# Patient Record
Sex: Male | Born: 1960
Health system: Southern US, Community
[De-identification: ages and names within clinical notes are randomized; demographics above are authoritative.]

## PROBLEM LIST (undated history)

## (undated) HISTORY — PX: HERNIA REPAIR: SHX51

## (undated) HISTORY — PX: RHINOPLASTY: SUR1284

## (undated) HISTORY — PX: WISDOM TOOTH EXTRACTION: SHX21

---

## 2014-09-12 ENCOUNTER — Encounter: Payer: Self-pay | Admitting: Internal Medicine

## 2015-05-21 ENCOUNTER — Encounter: Payer: Self-pay | Admitting: Internal Medicine

## 2015-07-27 ENCOUNTER — Ambulatory Visit (AMBULATORY_SURGERY_CENTER): Payer: Self-pay

## 2015-07-27 VITALS — Ht 72.0 in | Wt 198.0 lb

## 2015-07-27 DIAGNOSIS — Z1211 Encounter for screening for malignant neoplasm of colon: Secondary | ICD-10-CM

## 2015-07-27 NOTE — Progress Notes (Signed)
No allergies to eggs or soy No diet/weight loss meds No home oxygen No past problems with anesthesia  Has emai  Emmi instructions given for colonoscopy

## 2015-08-10 ENCOUNTER — Ambulatory Visit (AMBULATORY_SURGERY_CENTER): Payer: PRIVATE HEALTH INSURANCE | Admitting: Internal Medicine

## 2015-08-10 ENCOUNTER — Encounter: Payer: Self-pay | Admitting: Internal Medicine

## 2015-08-10 VITALS — BP 107/68 | HR 53 | Temp 96.6°F | Resp 18 | Ht 72.0 in | Wt 198.0 lb

## 2015-08-10 DIAGNOSIS — Z1211 Encounter for screening for malignant neoplasm of colon: Secondary | ICD-10-CM

## 2015-08-10 MED ORDER — SODIUM CHLORIDE 0.9 % IV SOLN: 500.0000 mL | INTRAVENOUS | Status: DC

## 2015-08-10 NOTE — Patient Instructions (Addendum)
No polyps or cancer detected!  You do have mild diverticulosis - thickened muscle rings and pouches in the colon wall. Please read the handout about this condition.  Next routine colonoscopy/screenin test in 10 years - 2026  I appreciate the opportunity to care for you. Iva Boop, MD, FACG       YOU HAD AN ENDOSCOPIC PROCEDURE TODAY AT THE [facility]:   Refer to the procedure report that was given to you for any specific questions about what was found during the examination.  If the procedure report does not answer your questions, please call your gastroenterologist to clarify.  If you requested that your care partner not be given the details of your procedure findings, then the procedure report has been included in a sealed envelope for you to review at your convenience later.  YOU SHOULD EXPECT: Some feelings of bloating in the abdomen. Passage of more gas than usual.  Walking can help get rid of the air that was put into your GI tract during the procedure and reduce the bloating. If you had a lower endoscopy (such as a colonoscopy or flexible sigmoidoscopy) you may notice spotting of blood in your stool or on the toilet paper. If you underwent a bowel prep for your procedure, you may not have a normal bowel movement for a few days.  Please Note:  You might notice some irritation and congestion in your nose or some drainage.  This is from the oxygen used during your procedure.  There is no need for concern and it should clear up in a day or so.  SYMPTOMS TO REPORT IMMEDIATELY:   Following lower endoscopy (colonoscopy or flexible sigmoidoscopy):  Excessive amounts of blood in the stool  Significant tenderness or worsening of abdominal pains  Swelling of the abdomen that is new, acute  Fever of 100F or higher    For urgent or emergent issues, a gastroenterologist can be reached at any hour by calling [phone].   DIET: Your first meal following the  procedure should be a small meal and then it is ok to progress to your normal diet. Heavy or fried foods are harder to digest and may make you feel nauseous or bloated.  Likewise, meals heavy in dairy and vegetables can increase bloating.  Drink plenty of fluids but you should avoid alcoholic beverages for 24 hours.  ACTIVITY:  You should plan to take it easy for the rest of today and you should NOT DRIVE or use heavy machinery until tomorrow (because of the sedation medicines used during the test).    FOLLOW UP: Our staff will call the number listed on your records the next business day following your procedure to check on you and address any questions or concerns that you may have regarding the information given to you following your procedure. If we do not reach you, we will leave a message.  However, if you are feeling well and you are not experiencing any problems, there is no need to return our call.  We will assume that you have returned to your regular daily activities without incident.  If any biopsies were taken you will be contacted by phone or by letter within the next 1-3 weeks.  Please call us at [phone] if you have not heard about the biopsies in 3 weeks.    SIGNATURES/CONFIDENTIALITY: You and/or your care partner have signed paperwork which will be entered into your electronic medical record.  These signatures attest  to the fact that that the information above on your After Visit Summary has been reviewed and is understood.  Full responsibility of the confidentiality of this discharge information lies with you and/or your care-partner.   Information given on diverticulosis.

## 2015-08-10 NOTE — Progress Notes (Signed)
To recovery, report to Brown, RN, VSS. 

## 2015-08-10 NOTE — Op Note (Signed)
[facility] [address] [place], [address]   COLONOSCOPY PROCEDURE REPORT  PATIENT: Matthew Gonzalez, Matthew Gonzalez  MR#: [id number] BIRTHDATE: 08-Apr-1961 , 54  yrs. old GENDER: male ENDOSCOPIST: Iva Boop, MD, [facility] PROCEDURE DATE:  08/10/2015 PROCEDURE:   Colonoscopy, screening First Screening Colonoscopy - Avg.  risk and is 50 yrs.  old or older Yes.  Prior Negative Screening - Now for repeat screening. N/A  History of Adenoma - Now for follow-up colonoscopy & has been > or = to 3 yrs.  N/A  Polyps removed today? No Recommend repeat exam, <10 yrs? No ASA CLASS:   Class I INDICATIONS:Screening for colonic neoplasia and Colorectal Neoplasm Risk Assessment for this procedure is average risk. MEDICATIONS: Propofol 240 mg IV and Monitored anesthesia care  DESCRIPTION OF PROCEDURE:   After the risks benefits and alternatives of the procedure were thoroughly explained, informed consent was obtained.  The digital rectal exam revealed no abnormalities of the rectum, revealed no prostatic nodules, and revealed the prostate was not enlarged.   The LB [id number] endoscope was introduced through the anus and advanced to the cecum, which was identified by both the appendix and ileocecal valve. No adverse events experienced.   The quality of the prep was excellent.  (MiraLax was used)  The instrument was then slowly withdrawn as the colon was fully examined. Estimated blood loss is zero unless otherwise noted in this procedure report.      COLON FINDINGS: There was mild diverticulosis noted in the sigmoid colon.   The examination was otherwise normal.   Right colon retroflexion included.  Retroflexed views revealed no abnormalities. The time to cecum = 2.4 Withdrawal time = 10.0   The scope was withdrawn and the procedure completed. COMPLICATIONS: There were no immediate complications.  ENDOSCOPIC IMPRESSION: 1.   Mild diverticulosis was noted in the sigmoid colon 2.    The examination was otherwise normal - excellent prep  RECOMMENDATIONS: Repeat colon screening test in 2026 -  10 years.  eSigned:  Iva Boop, MD, [facility] 08/10/2015 10:55 AM   cc: Rodrigo Ran, MD and The Patient

## 2015-08-11 ENCOUNTER — Telehealth: Payer: Self-pay | Admitting: *Deleted

## 2015-08-11 NOTE — Telephone Encounter (Signed)
  Follow up Call-  Call back number 08/10/2015  Post procedure Call Back phone  # [phone] Work number  Permission to leave phone message Yes     Patient questions:  Do you have a fever, pain , or abdominal swelling? No. Pain Score  0 *  Have you tolerated food without any problems? Yes.    Have you been able to return to your normal activities? Yes.    Do you have any questions about your discharge instructions: Diet   No. Medications  No. Follow up visit  No.  Do you have questions or concerns about your Care? No.  Actions: * If pain score is 4 or above: No action needed, pain <4.

## 2016-12-05 ENCOUNTER — Encounter (HOSPITAL_BASED_OUTPATIENT_CLINIC_OR_DEPARTMENT_OTHER): Payer: Self-pay | Admitting: *Deleted

## 2016-12-05 ENCOUNTER — Emergency Department (HOSPITAL_BASED_OUTPATIENT_CLINIC_OR_DEPARTMENT_OTHER)
Admission: EM | Admit: 2016-12-05 | Discharge: 2016-12-05 | Disposition: A | Discharge status: Home / Self Care | Payer: 59 | Attending: Emergency Medicine | Admitting: Emergency Medicine

## 2016-12-05 ENCOUNTER — Emergency Department (HOSPITAL_BASED_OUTPATIENT_CLINIC_OR_DEPARTMENT_OTHER): Payer: 59

## 2016-12-05 DIAGNOSIS — Y9389 Activity, other specified: Secondary | ICD-10-CM | POA: Diagnosis not present

## 2016-12-05 DIAGNOSIS — Y999 Unspecified external cause status: Secondary | ICD-10-CM | POA: Diagnosis not present

## 2016-12-05 DIAGNOSIS — F17290 Nicotine dependence, other tobacco product, uncomplicated: Secondary | ICD-10-CM | POA: Insufficient documentation

## 2016-12-05 DIAGNOSIS — W260XXA Contact with knife, initial encounter: Secondary | ICD-10-CM | POA: Diagnosis not present

## 2016-12-05 DIAGNOSIS — Z23 Encounter for immunization: Secondary | ICD-10-CM | POA: Insufficient documentation

## 2016-12-05 DIAGNOSIS — S61311A Laceration without foreign body of left index finger with damage to nail, initial encounter: Secondary | ICD-10-CM | POA: Diagnosis not present

## 2016-12-05 DIAGNOSIS — Y929 Unspecified place or not applicable: Secondary | ICD-10-CM | POA: Insufficient documentation

## 2016-12-05 MED ORDER — TETANUS-DIPHTH-ACELL PERTUSSIS 5-2.5-18.5 LF-MCG/0.5 IM SUSP
0.5000 mL | Freq: Once | INTRAMUSCULAR | Status: AC
  Administered 2016-12-05: 0.5 mL via INTRAMUSCULAR
  Filled 2016-12-05: qty 0.5

## 2016-12-05 MED ORDER — LIDOCAINE HCL 1 % IJ SOLN
20.0000 mL | Freq: Once | INTRAMUSCULAR | Status: AC
  Administered 2016-12-05: 20 mL via INTRADERMAL
  Filled 2016-12-05: qty 20

## 2016-12-05 NOTE — ED Provider Notes (Signed)
[facility] Provider Note   CSN: [id number] Arrival date & time: 12/05/16  1907  By signing my name below, I, Vista Mink, attest that this documentation has been prepared under the direction and in the presence of Melene Plan, DO. Electronically signed, Vista Mink, ED Scribe. 12/05/16. 10:01 PM.   History   Chief Complaint Chief Complaint  Patient presents with  . Laceration    HPI HPI Comments: Matthew Gonzalez is a 55 y.o. male who presents to the Emergency Department s/p an injury to his left pointer finger that occurred just prior to arrival. Pt was using a kitchen knife to cut up some herbs when he accidentally cut his finger. Pt has a small linear laceration on the finger, bleeding currently controlled. He is not taking any blood thinning medication. Sensation intact. He is unsure of last Tetanus. No other associated symptoms.   The history is provided by the patient. No language interpreter was used.    History reviewed. No pertinent past medical history.  There are no active problems to display for this patient.   Past Surgical History:  Procedure Laterality Date  . HERNIA REPAIR    . RHINOPLASTY    . WISDOM TOOTH EXTRACTION       Home Medications    Prior to Admission medications   Not on File    Family History Family History  Problem Relation Age of Onset  . Colon cancer Neg Hx   . Colon polyps Neg Hx     Social History Social History  Substance Use Topics  . Smoking status: Current Some Day Smoker    Types: Cigars  . Smokeless tobacco: Never Used  . Alcohol use 4.8 - 6.0 oz/week    8 - 10 Cans of beer per week    Allergies   Patient has no known allergies.   Review of Systems Review of Systems  Constitutional: Negative for chills and fever.  HENT: Negative for congestion and facial swelling.   Eyes: Negative for discharge and visual disturbance.  Respiratory: Negative for shortness of breath.   Cardiovascular: Negative for  chest pain and palpitations.  Gastrointestinal: Negative for abdominal pain, diarrhea and vomiting.  Musculoskeletal: Negative for arthralgias and myalgias.  Skin: Positive for wound (laceration). Negative for color change and rash.  Neurological: Negative for tremors, syncope and headaches.  Psychiatric/Behavioral: Negative for confusion and dysphoric mood.  All other systems reviewed and are negative.    Physical Exam Updated Vital Signs BP 148/93 (BP Location: Right Arm)   Pulse 66   Temp 97.9 F (36.6 C) (Oral)   Resp 18   Ht 6' (1.829 m)   Wt 230 lb (104.3 kg)   SpO2 99%   BMI 31.19 kg/m   Physical Exam  Constitutional: He is oriented to person, place, and time. He appears well-developed and well-nourished.  HENT:  Head: Normocephalic and atraumatic.  Eyes: EOM are normal. Pupils are equal, round, and reactive to light.  Neck: Normal range of motion. Neck supple. No JVD present.  Cardiovascular: Normal rate and regular rhythm.  Exam reveals no gallop and no friction rub.   No murmur heard. Pulmonary/Chest: No respiratory distress. He has no wheezes.  Abdominal: He exhibits no distension. There is no rebound and no guarding.  Musculoskeletal: Normal range of motion.  Neurological: He is alert and oriented to person, place, and time.  Skin: No rash noted. No pallor.  Laceration 2.7cm just above the nailbed to the lateral aspect of  left second digit extending through the nail and lateral aspect of the finger. Sensation intact. Cap refill intact.   Psychiatric: He has a normal mood and affect. His behavior is normal.  Nursing note and vitals reviewed.    ED Treatments / Results  DIAGNOSTIC STUDIES: Oxygen Saturation is 99% on RA, normal by my interpretation.  COORDINATION OF CARE: 9:35 PM-Discussed treatment plan with pt at bedside and pt agreed to plan.   Labs (all labs ordered are listed, but only abnormal results are displayed) Labs Reviewed - No data to  display  EKG  EKG Interpretation None       Radiology Dg Finger Index Left  Result Date: 12/05/2016 CLINICAL DATA:  Pt states he cut his left index finger with a knife this evening while cooking dinner. States laceration is across nailbed. Finger wrapped in gauze to control bleeding. EXAM: LEFT INDEX FINGER 2+V COMPARISON:  None. FINDINGS: There is no evidence of fracture or dislocation. There is no evidence of arthropathy or other focal bone abnormality. No radiodense foreign body. No subcutaneous gas. IMPRESSION: Negative. Electronically Signed   By: Corlis Leak  Hassell M.D.   On: 12/05/2016 19:47    Procedures .Marland Kitchen.Laceration Repair Date/Time: 12/05/2016 10:00 PM Performed by: Adela LankFLOYD, Tasheena Wambolt Authorized by: Melene PlanFLOYD, Marquel Pottenger   Consent:    Consent obtained:  Verbal   Consent given by:  Patient   Risks discussed:  Infection, poor cosmetic result and poor wound healing   Alternatives discussed:  No treatment, delayed treatment and observation Anesthesia (see MAR for exact dosages):    Anesthesia method:  Nerve block and local infiltration   Block location:  R first finger   Block needle gauge:  27 G   Block injection procedure:  Anatomic landmarks identified and anatomic landmarks palpated   Block outcome:  Incomplete block (Local infiltration then used) Laceration details:    Location:  Finger   Finger location:  L index finger   Length (cm):  2.7 Repair type:    Repair type:  Complex Pre-procedure details:    Preparation:  Patient was prepped and draped in usual sterile fashion Exploration:    Limited defect created (wound extended): no     Hemostasis achieved with:  Direct pressure and tourniquet   Wound exploration: wound explored through full range of motion and entire depth of wound probed and visualized     Contaminated: no   Treatment:    Area cleansed with:  Betadine   Amount of cleaning:  Extensive   Irrigation solution:  Tap water   Irrigation method:  Tap   Visualized foreign  bodies/material removed: no     Debridement:  Minimal   Undermining:  None   Scar revision: no   Skin repair:    Repair method:  Sutures   Suture size:  4-0   Suture material:  Nylon   Suture technique:  Simple interrupted   Number of sutures:  4 Approximation:    Approximation:  Close Post-procedure details:    Dressing:  Bulky dressing   Patient tolerance of procedure:  Tolerated well, no immediate complications Comments:     Lac through nail, sutured to hold in place, no noted nail bed involvement    (including critical care time)  Medications Ordered in ED Medications  lidocaine (XYLOCAINE) 1 % (with pres) injection 20 mL (20 mLs Intradermal Given 12/05/16 2137)  Tdap (BOOSTRIX) injection 0.5 mL (0.5 mLs Intramuscular Given 12/05/16 2135)     Initial Impression / Assessment and Plan /  ED Course  I have reviewed the triage vital signs and the nursing notes.  Pertinent labs & imaging results that were available during my care of the patient were reviewed by me and considered in my medical decision making (see chart for details).  Clinical Course     55 yo M With a chief complaint of a laceration to the left second digit. This happened while he was cutting herbs for dinner. Laceration through the nail but not through the nailbed. X-ray with no fracture. Sutured at bedside. Discharge home.  11:33 PM:  I have discussed the diagnosis/risks/treatment options with the patient and family and believe the pt to be eligible for discharge home to follow-up with PCP. We also discussed returning to the ED immediately if new or worsening sx occur. We discussed the sx which are most concerning (e.g., redness, drainage) that necessitate immediate return. Medications administered to the patient during their visit and any new prescriptions provided to the patient are listed below.  Medications given during this visit Medications  lidocaine (XYLOCAINE) 1 % (with pres) injection 20 mL (20 mLs  Intradermal Given 12/05/16 2137)  Tdap (BOOSTRIX) injection 0.5 mL (0.5 mLs Intramuscular Given 12/05/16 2135)     The patient appears reasonably screen and/or stabilized for discharge and I doubt any other medical condition or other [facility] requiring further screening, evaluation, or treatment in the ED at this time prior to discharge.    Final Clinical Impressions(s) / ED Diagnoses   Final diagnoses:  Laceration of left index finger without foreign body with damage to nail, initial encounter    New Prescriptions New Prescriptions   No medications on file  I personally performed the services described in this documentation, which was scribed in my presence. The recorded information has been reviewed and is accurate.     Melene Planan Shai Mckenzie, DO 12/05/16 2333

## 2016-12-05 NOTE — ED Triage Notes (Signed)
Laceration to his left index finger with a knife while cooking dinner.

## 2018-05-22 ENCOUNTER — Encounter: Payer: Self-pay | Admitting: Family Medicine

## 2018-05-22 ENCOUNTER — Other Ambulatory Visit: Payer: Self-pay

## 2018-05-22 ENCOUNTER — Ambulatory Visit: Payer: 59 | Admitting: Family Medicine

## 2018-05-22 VITALS — BP 124/78 | HR 53 | Temp 97.7°F | Ht 72.0 in | Wt 219.6 lb

## 2018-05-22 DIAGNOSIS — S80861A Insect bite (nonvenomous), right lower leg, initial encounter: Secondary | ICD-10-CM | POA: Diagnosis not present

## 2018-05-22 DIAGNOSIS — L03115 Cellulitis of right lower limb: Secondary | ICD-10-CM | POA: Diagnosis not present

## 2018-05-22 DIAGNOSIS — W57XXXA Bitten or stung by nonvenomous insect and other nonvenomous arthropods, initial encounter: Secondary | ICD-10-CM | POA: Diagnosis not present

## 2018-05-22 MED ORDER — DOXYCYCLINE HYCLATE 100 MG PO TABS: 100.0000 mg | ORAL_TABLET | Freq: Two times a day (BID) | ORAL | 0 refills | Status: AC

## 2018-05-22 NOTE — Patient Instructions (Addendum)
Based on increasing redness at the area from previous tick bite, we can treat for early cellulitis at this time.  Start antibiotic twice per day for the next 1 week.  If you have any side effects or problems tolerating that medication,  we do have the option of topical treatment only with close monitoring based on the current size of the lesion.  Let me know if that occurs. Return to the clinic or go to the nearest emergency room if any of your symptoms worsen or new symptoms occur.  See other information on ticks below.  Thank you for coming in today.  Tick Bite Information, Adult Ticks are insects that draw blood for food. Most ticks live in shrubs and grassy areas. They climb onto people and animals that brush against the leaves and grasses that they rest on. Then they bite, attaching themselves to the skin. Most ticks are harmless, but some ticks carry germs that can spread to a person through a bite and cause a disease. To reduce your risk of getting a disease from a tick bite, it is important to take steps to prevent tick bites. It is also important to check for ticks after being outdoors. If you find that a tick has attached to you, watch for symptoms of disease. How can I prevent tick bites? Take these steps to help prevent tick bites when you are outdoors in an area where ticks are found:  Use insect repellent that has DEET (20% or higher), picaridin, or IR3535 in it. Use it on: ? Skin that is showing. ? The top of your boots. ? Your pant legs. ? Your sleeve cuffs.  For repellent products that contain permethrin, follow product instructions. Use these products on: ? Clothing. ? Gear. ? Boots. ? Tents.  Wear protective clothing. Long sleeves and long pants offer the best protection from ticks.  Wear light-colored clothing so you can see ticks more easily.  Tuck your pant legs into your socks.  If you go walking on a trail, stay in the middle of the trail so your skin, hair, and  clothing do not touch the bushes.  Avoid walking through areas with long grass.  Check for ticks on your clothing, hair, and skin often while you are outside, and check again before you go inside. Make sure to check the places that ticks attach themselves most often. These places include the scalp, neck, armpits, waist, groin, and joint areas. Ticks that carry a disease called Lyme disease have to be attached to the skin for 24-48 hours. Checking for ticks every day will lessen your risk of this and other diseases.  When you come indoors, wash your clothes and take a shower or a bath right away. Dry your clothes in a dryer on high heat for at least 60 minutes. This will kill any ticks in your clothes.  What is the proper way to remove a tick? If you find a tick on your body, remove it as soon as possible. Removing a tick sooner rather than later can prevent germs from passing from the tick to your body. To remove a tick that is crawling on your skin but has not bitten:  Go outdoors and brush the tick off.  Remove the tick with tape or a lint roller.  To remove a tick that is attached to your skin:  Wash your hands.  If you have latex gloves, put them on.  Use tweezers, curved forceps, or a tick-removal tool to  gently grasp the tick as close to your skin and the tick's head as possible.  Gently pull with steady, upward pressure until the tick lets go. When removing the tick: ? Take care to keep the tick's head attached to its body. ? Do not twist or jerk the tick. This can make the tick's head or mouth break off. ? Do not squeeze or crush the tick's body. This could force disease-carrying fluids from the tick into your body.  Do not try to remove a tick with heat, alcohol, petroleum jelly, or fingernail polish. Using these methods can cause the tick to salivate and regurgitate into your bloodstream, increasing your risk of getting a disease. What should I do after removing a  tick?  Clean the bite area with soap and water, rubbing alcohol, or an iodine scrub.  If an antiseptic cream or ointment is available, apply a small amount to the bite site.  Wash and disinfect any instruments that you used to remove the tick. How should I dispose of a tick? To dispose of a live tick, use one of these methods:  Place it in rubbing alcohol.  Place it in a sealed bag or container.  Wrap it tightly in tape.  Flush it down the toilet.  Contact a health care provider if:  You have symptoms of a disease after a tick bite. Symptoms of a tick-borne disease can occur from moments after the tick bites to up to 30 days after a tick is removed. Symptoms include: ? Muscle, joint, or bone pain. ? Difficulty walking or moving your legs. ? Numbness in the legs. ? Paralysis. ? Red rash around the tick bite area that is shaped like a target or a "bull's-eye." ? Redness and swelling in the area of the tick bite. ? Fever. ? Repeated vomiting. ? Diarrhea. ? Weight loss. ? Tender, swollen lymph glands. ? Shortness of breath. ? Cough. ? Pain in the abdomen. ? Headache. ? Abnormal tiredness. ? A change in your level of consciousness. ? Confusion. Get help right away if:  You are not able to remove a tick.  A part of a tick breaks off and gets stuck in your skin.  Your symptoms get worse. Summary  Ticks may carry germs that can spread to a person through a bite and cause disease.  Wear protective clothing and use insect repellent to prevent tick bites. Follow product instructions.  If you find a tick on your body, remove it as soon as possible. If the tick is attached, do not try to remove with heat, alcohol, petroleum jelly, or fingernail polish.  Remove the attached tick using tweezers, curved forceps, or a tick-removal tool. Gently pull with steady, upward pressure until the tick lets go. Do not twist or jerk the tick. Do not squeeze or crush the tick's body.  If  you have symptoms after being bitten by a tick, contact a health care provider. This information is not intended to replace advice given to you by your health care provider. Make sure you discuss any questions you have with your health care provider. Document Released: 12/02/2000 Document Revised: 09/16/2016 Document Reviewed: 09/16/2016 Elsevier Interactive Patient Education  2018 [employer]  Cellulitis, Adult Cellulitis is a skin infection. The infected area is usually red and tender. This condition occurs most often in the arms and lower legs. The infection can travel to the muscles, blood, and underlying tissue and become serious. It is very important to get treated for this  condition. What are the causes? Cellulitis is caused by bacteria. The bacteria enter through a break in the skin, such as a cut, burn, insect bite, open sore, or crack. What increases the risk? This condition is more likely to occur in people who:  Have a weak defense system (immune system).  Have open wounds on the skin such as cuts, burns, bites, and scrapes. Bacteria can enter the body through these open wounds.  Are older.  Have diabetes.  Have a type of long-lasting (chronic) liver disease (cirrhosis) or kidney disease.  Use IV drugs.  What are the signs or symptoms? Symptoms of this condition include:  Redness, streaking, or spotting on the skin.  Swollen area of the skin.  Tenderness or pain when an area of the skin is touched.  Warm skin.  Fever.  Chills.  Blisters.  How is this diagnosed? This condition is diagnosed based on a medical history and physical exam. You may also have tests, including:  Blood tests.  Lab tests.  Imaging tests.  How is this treated? Treatment for this condition may include:  Medicines, such as antibiotic medicines or antihistamines.  Supportive care, such as rest and application of cold or warm cloths (cold or warm compresses) to the  skin.  Hospital care, if the condition is severe.  The infection usually gets better within 1-2 days of treatment. Follow these instructions at home:  Take over-the-counter and prescription medicines only as told by your health care provider.  If you were prescribed an antibiotic medicine, take it as told by your health care provider. Do not stop taking the antibiotic even if you start to feel better.  Drink enough fluid to keep your urine clear or pale yellow.  Do not touch or rub the infected area.  Raise (elevate) the infected area above the level of your heart while you are sitting or lying down.  Apply warm or cold compresses to the affected area as told by your health care provider.  Keep all follow-up visits as told by your health care provider. This is important. These visits let your health care provider make sure a more serious infection is not developing. Contact a health care provider if:  You have a fever.  Your symptoms do not improve within 1-2 days of starting treatment.  Your bone or joint underneath the infected area becomes painful after the skin has healed.  Your infection returns in the same area or another area.  You notice a swollen bump in the infected area.  You develop new symptoms.  You have a general ill feeling (malaise) with muscle aches and pains. Get help right away if:  Your symptoms get worse.  You feel very sleepy.  You develop vomiting or diarrhea that persists.  You notice red streaks coming from the infected area.  Your red area gets larger or turns dark in color. This information is not intended to replace advice given to you by your health care provider. Make sure you discuss any questions you have with your health care provider. Document Released: 09/14/2005 Document Revised: 04/14/2016 Document Reviewed: 10/14/2015 Elsevier Interactive Patient Education  2018 [employer].   IF you received an x-ray today, you will receive  an invoice from [facility]. Please contact [facility] at [phone] with questions or concerns regarding your invoice.   IF you received labwork today, you will receive an invoice from [facility]. Please contact [facility] at [phone] with questions or concerns regarding your invoice.   Our  billing staff will not be able to assist you with questions regarding bills from these companies.  You will be contacted with the lab results as soon as they are available. The fastest way to get your results is to activate your My Chart account. Instructions are located on the last page of this paperwork. If you have not heard from us regarding the results in 2 weeks, please contact this office.      

## 2018-05-22 NOTE — Progress Notes (Signed)
Subjective:  By signing my name below, I, Matthew Gonzalez, attest that this documentation has been prepared under the direction and in the presence of [name], MD Electronically Signed: Charline BillsEssence Gonzalez, ED Scribe 05/22/2018 at 11:44 AM.   Patient ID: Matthew RotundaGregory M Gonzalez, male    DOB: 1961/01/01, 57 y.o.   MRN: [id number]  Chief Complaint  Patient presents with  . Establish Care    Tick bite-  right ankle (on body for 24 min max)   HPI Matthew RotundaGregory M Minnehan is a 57 y.o. male who presents to Primary Care at [facility] to establish care.  Pt reports tick bite to the R ankle first noticed 3 days ago while [facility] [place]. He thinks the tick was attached approximately 1 hour prior to removing it. He did initially notice a red dot that has progressively worsened and pus from the bite site. He has cleaned the area with soap and water; no other treatments tried PTA. Denies HA, fever, abdominal pain, rash, bullet lesion or red streaking. No known drug allergies.  There are no active problems to display for this patient.  No past medical history on file. Past Surgical History:  Procedure Laterality Date  . HERNIA REPAIR    . RHINOPLASTY    . WISDOM TOOTH EXTRACTION     No Known Allergies Prior to Admission medications   Not on File   Social History   Socioeconomic History  . Marital status: Single    Spouse name: Not on file  . Number of children: Not on file  . Years of education: Not on file  . Highest education level: Not on file  Occupational History  . Not on file  Social Needs  . Financial resource strain: Not on file  . Food insecurity:    Worry: Not on file    Inability: Not on file  . Transportation needs:    Medical: Not on file    Non-medical: Not on file  Tobacco Use  . Smoking status: Current Some Day Smoker    Types: Cigars  . Smokeless tobacco: Never Used  Substance and Sexual Activity  . Alcohol use: Yes    Alcohol/week: 4.8 - 6.0 oz    Types: 8 -  10 Cans of beer per week  . Drug use: No  . Sexual activity: Not on file  Lifestyle  . Physical activity:    Days per week: Not on file    Minutes per session: Not on file  . Stress: Not on file  Relationships  . Social connections:    Talks on phone: Not on file    Gets together: Not on file    Attends religious service: Not on file    Active member of club or organization: Not on file    Attends meetings of clubs or organizations: Not on file    Relationship status: Not on file  . Intimate partner violence:    Fear of current or ex partner: Not on file    Emotionally abused: Not on file    Physically abused: Not on file    Forced sexual activity: Not on file  Other Topics Concern  . Not on file  Social History Narrative  . Not on file   Review of Systems  Constitutional: Negative for fever.  Gastrointestinal: Negative for abdominal pain.  Skin: Positive for color change and wound (bite to R ankle). Negative for rash.  Neurological: Negative for headaches.  Objective:   Physical Exam  Constitutional: He is oriented to person, place, and time. He appears well-developed and well-nourished. No distress.  HENT:  Head: Normocephalic and atraumatic.  Eyes: Conjunctivae and EOM are normal.  Neck: Neck supple. No tracheal deviation present.  Cardiovascular: Normal rate.  Pulmonary/Chest: Effort normal. No respiratory distress.  Musculoskeletal: Normal range of motion.  Neurological: He is alert and oriented to person, place, and time.  Skin: Skin is warm and dry.  R medial lower leg proximal to medial malleolus there is an erythematous patch ~6-7 mm across. Appears to be 2 small openings centrally with small crusting. No active discharge. No vascular streaking. No lymphadenopathy at ankle. No popliteal lymphadenopathy. No appreciable fluctuance centrally. Very minimal induration of the area. No tick parts noted.  Psychiatric: He has a normal mood and affect. His behavior is  normal.  Nursing note and vitals reviewed.  Vitals:   05/22/18 1130  BP: 124/78  Pulse: (!) 53  Temp: 97.7 F (36.5 C)  TempSrc: Oral  SpO2: 100%  Weight: 219 lb 9.6 oz (99.6 kg)  Height: 6' (1.829 m)      Assessment & Plan:    DARRILL VREELAND is a 57 y.o. male Cellulitis of leg, right - Plan: doxycycline (VIBRA-TABS) 100 MG tablet  Tick bite of right lower leg, initial encounter - Plan: doxycycline (VIBRA-TABS) 100 MG tablet  -Minimal attachment time, but progressive erythema from location of prior tick bite.  Possible early cellulitis.   - Treatment options discussed, will initially treat with doxycycline 100 mg twice daily for 1 week.  Potential side effects and risks discussed.  If he has trouble tolerating the medication could switch to topical Bactroban based on current size of lesion.   - Information provided on cellulitis, RTC precautions, and tick bite information also reviewed on handout.   Meds ordered this encounter  Medications  . doxycycline (VIBRA-TABS) 100 MG tablet    Sig: Take 1 tablet (100 mg total) by mouth 2 (two) times daily.    Dispense:  14 tablet    Refill:  0   Patient Instructions    Based on increasing redness at the area from previous tick bite, we can treat for early cellulitis at this time.  Start antibiotic twice per day for the next 1 week.  If you have any side effects or problems tolerating that medication,  we do have the option of topical treatment only with close monitoring based on the current size of the lesion.  Let me know if that occurs. Return to the clinic or go to the nearest emergency room if any of your symptoms worsen or new symptoms occur.  See other information on ticks below.  Thank you for coming in today.  Tick Bite Information, Adult Ticks are insects that draw blood for food. Most ticks live in shrubs and grassy areas. They climb onto people and animals that brush against the leaves and grasses that they rest on. Then  they bite, attaching themselves to the skin. Most ticks are harmless, but some ticks carry germs that can spread to a person through a bite and cause a disease. To reduce your risk of getting a disease from a tick bite, it is important to take steps to prevent tick bites. It is also important to check for ticks after being outdoors. If you find that a tick has attached to you, watch for symptoms of disease. How can I prevent tick bites? Take these steps to  help prevent tick bites when you are outdoors in an area where ticks are found:  Use insect repellent that has DEET (20% or higher), picaridin, or IR3535 in it. Use it on: ? Skin that is showing. ? The top of your boots. ? Your pant legs. ? Your sleeve cuffs.  For repellent products that contain permethrin, follow product instructions. Use these products on: ? Clothing. ? Gear. ? Boots. ? Tents.  Wear protective clothing. Long sleeves and long pants offer the best protection from ticks.  Wear light-colored clothing so you can see ticks more easily.  Tuck your pant legs into your socks.  If you go walking on a trail, stay in the middle of the trail so your skin, hair, and clothing do not touch the bushes.  Avoid walking through areas with long grass.  Check for ticks on your clothing, hair, and skin often while you are outside, and check again before you go inside. Make sure to check the places that ticks attach themselves most often. These places include the scalp, neck, armpits, waist, groin, and joint areas. Ticks that carry a disease called Lyme disease have to be attached to the skin for 24-48 hours. Checking for ticks every day will lessen your risk of this and other diseases.  When you come indoors, wash your clothes and take a shower or a bath right away. Dry your clothes in a dryer on high heat for at least 60 minutes. This will kill any ticks in your clothes.  What is the proper way to remove a tick? If you find a tick on  your body, remove it as soon as possible. Removing a tick sooner rather than later can prevent germs from passing from the tick to your body. To remove a tick that is crawling on your skin but has not bitten:  Go outdoors and brush the tick off.  Remove the tick with tape or a lint roller.  To remove a tick that is attached to your skin:  Wash your hands.  If you have latex gloves, put them on.  Use tweezers, curved forceps, or a tick-removal tool to gently grasp the tick as close to your skin and the tick's head as possible.  Gently pull with steady, upward pressure until the tick lets go. When removing the tick: ? Take care to keep the tick's head attached to its body. ? Do not twist or jerk the tick. This can make the tick's head or mouth break off. ? Do not squeeze or crush the tick's body. This could force disease-carrying fluids from the tick into your body.  Do not try to remove a tick with heat, alcohol, petroleum jelly, or fingernail polish. Using these methods can cause the tick to salivate and regurgitate into your bloodstream, increasing your risk of getting a disease. What should I do after removing a tick?  Clean the bite area with soap and water, rubbing alcohol, or an iodine scrub.  If an antiseptic cream or ointment is available, apply a small amount to the bite site.  Wash and disinfect any instruments that you used to remove the tick. How should I dispose of a tick? To dispose of a live tick, use one of these methods:  Place it in rubbing alcohol.  Place it in a sealed bag or container.  Wrap it tightly in tape.  Flush it down the toilet.  Contact a health care provider if:  You have symptoms of a disease after a  tick bite. Symptoms of a tick-borne disease can occur from moments after the tick bites to up to 30 days after a tick is removed. Symptoms include: ? Muscle, joint, or bone pain. ? Difficulty walking or moving your legs. ? Numbness in the  legs. ? Paralysis. ? Red rash around the tick bite area that is shaped like a target or a "bull's-eye." ? Redness and swelling in the area of the tick bite. ? Fever. ? Repeated vomiting. ? Diarrhea. ? Weight loss. ? Tender, swollen lymph glands. ? Shortness of breath. ? Cough. ? Pain in the abdomen. ? Headache. ? Abnormal tiredness. ? A change in your level of consciousness. ? Confusion. Get help right away if:  You are not able to remove a tick.  A part of a tick breaks off and gets stuck in your skin.  Your symptoms get worse. Summary  Ticks may carry germs that can spread to a person through a bite and cause disease.  Wear protective clothing and use insect repellent to prevent tick bites. Follow product instructions.  If you find a tick on your body, remove it as soon as possible. If the tick is attached, do not try to remove with heat, alcohol, petroleum jelly, or fingernail polish.  Remove the attached tick using tweezers, curved forceps, or a tick-removal tool. Gently pull with steady, upward pressure until the tick lets go. Do not twist or jerk the tick. Do not squeeze or crush the tick's body.  If you have symptoms after being bitten by a tick, contact a health care provider. This information is not intended to replace advice given to you by your health care provider. Make sure you discuss any questions you have with your health care provider. Document Released: 12/02/2000 Document Revised: 09/16/2016 Document Reviewed: 09/16/2016 Elsevier Interactive Patient Education  2018 [employer].  Cellulitis, Adult Cellulitis is a skin infection. The infected area is usually red and tender. This condition occurs most often in the arms and lower legs. The infection can travel to the muscles, blood, and underlying tissue and become serious. It is very important to get treated for this condition. What are the causes? Cellulitis is caused by bacteria. The bacteria enter through  a break in the skin, such as a cut, burn, insect bite, open sore, or crack. What increases the risk? This condition is more likely to occur in people who:  Have a weak defense system (immune system).  Have open wounds on the skin such as cuts, burns, bites, and scrapes. Bacteria can enter the body through these open wounds.  Are older.  Have diabetes.  Have a type of long-lasting (chronic) liver disease (cirrhosis) or kidney disease.  Use IV drugs.  What are the signs or symptoms? Symptoms of this condition include:  Redness, streaking, or spotting on the skin.  Swollen area of the skin.  Tenderness or pain when an area of the skin is touched.  Warm skin.  Fever.  Chills.  Blisters.  How is this diagnosed? This condition is diagnosed based on a medical history and physical exam. You may also have tests, including:  Blood tests.  Lab tests.  Imaging tests.  How is this treated? Treatment for this condition may include:  Medicines, such as antibiotic medicines or antihistamines.  Supportive care, such as rest and application of cold or warm cloths (cold or warm compresses) to the skin.  Hospital care, if the condition is severe.  The infection usually gets better within 1-2  days of treatment. Follow these instructions at home:  Take over-the-counter and prescription medicines only as told by your health care provider.  If you were prescribed an antibiotic medicine, take it as told by your health care provider. Do not stop taking the antibiotic even if you start to feel better.  Drink enough fluid to keep your urine clear or pale yellow.  Do not touch or rub the infected area.  Raise (elevate) the infected area above the level of your heart while you are sitting or lying down.  Apply warm or cold compresses to the affected area as told by your health care provider.  Keep all follow-up visits as told by your health care provider. This is important. These  visits let your health care provider make sure a more serious infection is not developing. Contact a health care provider if:  You have a fever.  Your symptoms do not improve within 1-2 days of starting treatment.  Your bone or joint underneath the infected area becomes painful after the skin has healed.  Your infection returns in the same area or another area.  You notice a swollen bump in the infected area.  You develop new symptoms.  You have a general ill feeling (malaise) with muscle aches and pains. Get help right away if:  Your symptoms get worse.  You feel very sleepy.  You develop vomiting or diarrhea that persists.  You notice red streaks coming from the infected area.  Your red area gets larger or turns dark in color. This information is not intended to replace advice given to you by your health care provider. Make sure you discuss any questions you have with your health care provider. Document Released: 09/14/2005 Document Revised: 04/14/2016 Document Reviewed: 10/14/2015 Elsevier Interactive Patient Education  2018 [employer].   IF you received an x-ray today, you will receive an invoice from [facility]. Please contact [facility] at [phone] with questions or concerns regarding your invoice.   IF you received labwork today, you will receive an invoice from [facility]. Please contact [facility] at [phone] with questions or concerns regarding your invoice.   Our billing staff will not be able to assist you with questions regarding bills from these companies.  You will be contacted with the lab results as soon as they are available. The fastest way to get your results is to activate your My Chart account. Instructions are located on the last page of this paperwork. If you have not heard from [place] regarding the results in 2 weeks, please contact this office.       I personally performed the services described in this documentation, which  was scribed in my presence. The recorded information has been reviewed and considered for accuracy and completeness, addended by me as needed, and agree with information above.  Signed,   Meredith Staggers, MD Primary Care at [facility].  05/22/18 11:58 AM

## 2018-06-22 IMAGING — DX DG FINGER INDEX 2+V*L*
3 series · 3 of 3 positions shown · non-contrast
Comparison: None.

CLINICAL DATA: Pt states he cut his left index finger with a knife
this evening while cooking dinner. States laceration is across
nailbed. Finger wrapped in gauze to control bleeding.

EXAM:
LEFT INDEX FINGER 2+V

[finger ap]
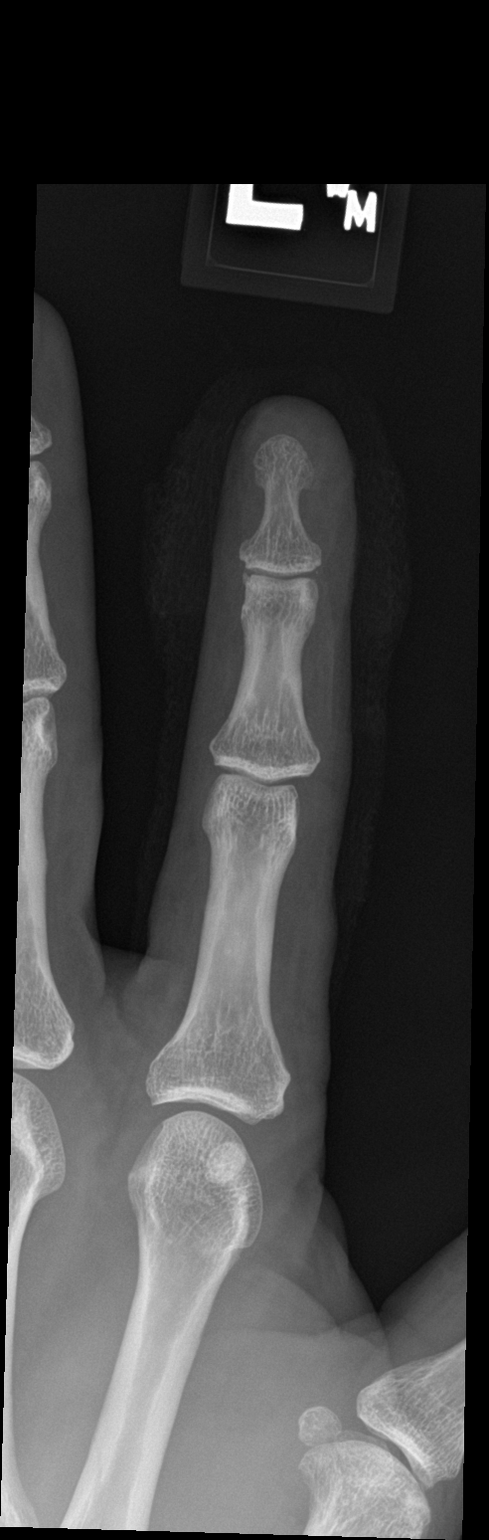

[finger obl]
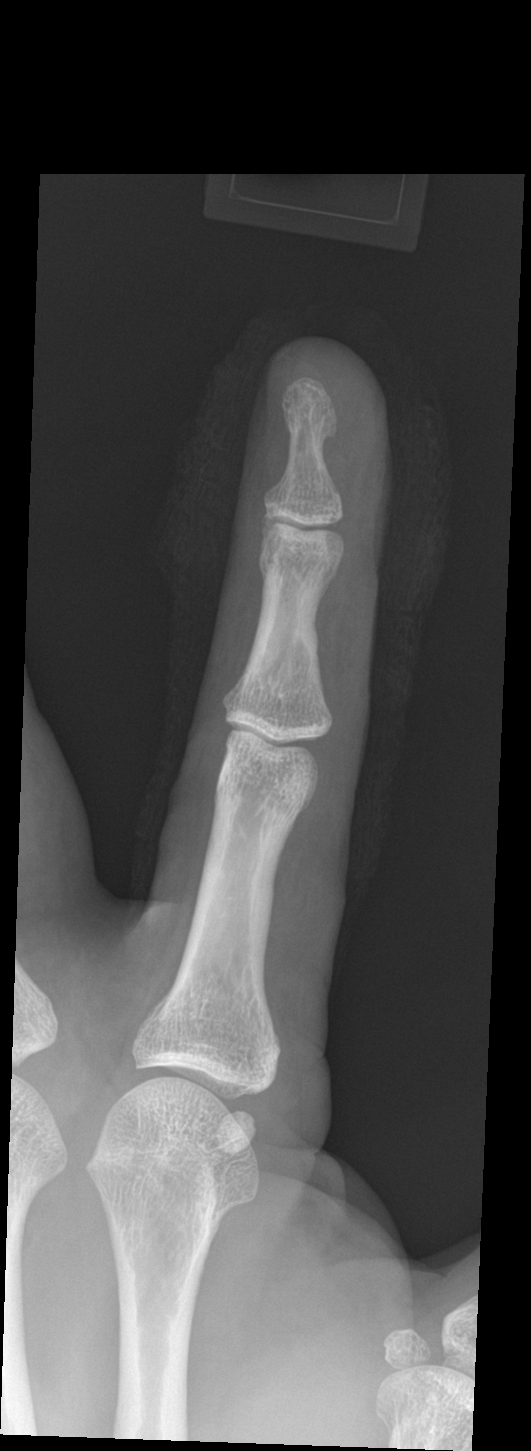

[finger lat]
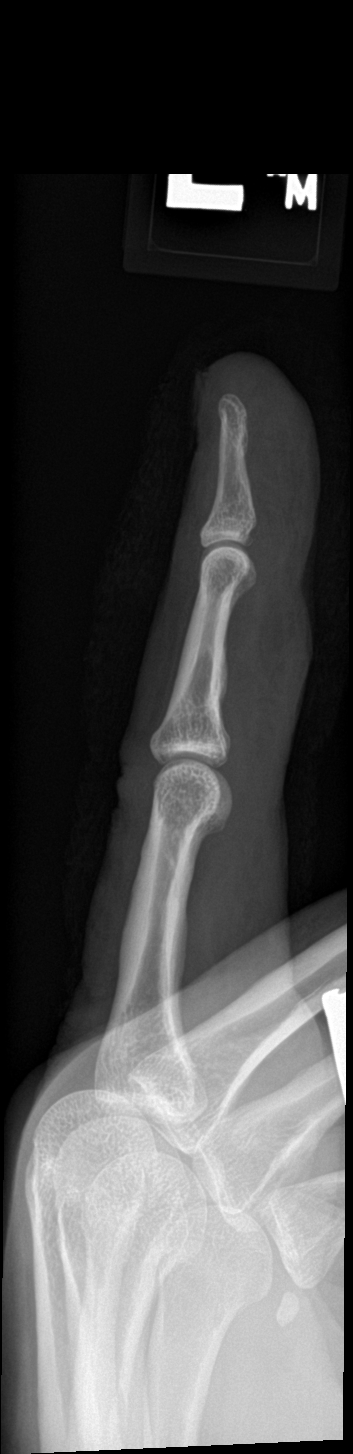

[3 of 3 positions shown; findings below may reference images not displayed]

FINDINGS: There is no evidence of fracture or dislocation. There is no
evidence of arthropathy or other focal bone abnormality. No
radiodense foreign body. No subcutaneous gas.
IMPRESSION: Negative.

## 2018-06-27 DIAGNOSIS — M5030 Other cervical disc degeneration, unspecified cervical region: Secondary | ICD-10-CM | POA: Diagnosis not present

## 2018-06-27 DIAGNOSIS — M75101 Unspecified rotator cuff tear or rupture of right shoulder, not specified as traumatic: Secondary | ICD-10-CM | POA: Diagnosis not present

## 2018-06-27 DIAGNOSIS — M545 Low back pain: Secondary | ICD-10-CM | POA: Diagnosis not present

## 2018-07-09 DIAGNOSIS — M5030 Other cervical disc degeneration, unspecified cervical region: Secondary | ICD-10-CM | POA: Diagnosis not present

## 2018-07-09 DIAGNOSIS — M545 Low back pain: Secondary | ICD-10-CM | POA: Diagnosis not present

## 2018-08-28 DIAGNOSIS — M4310 Spondylolisthesis, site unspecified: Secondary | ICD-10-CM | POA: Diagnosis not present

## 2018-09-06 DIAGNOSIS — M545 Low back pain: Secondary | ICD-10-CM | POA: Diagnosis not present

## 2018-09-11 DIAGNOSIS — M545 Low back pain: Secondary | ICD-10-CM | POA: Diagnosis not present

## 2018-09-11 DIAGNOSIS — M4310 Spondylolisthesis, site unspecified: Secondary | ICD-10-CM | POA: Diagnosis not present

## 2018-09-25 DIAGNOSIS — M5136 Other intervertebral disc degeneration, lumbar region: Secondary | ICD-10-CM | POA: Diagnosis not present

## 2018-09-25 DIAGNOSIS — M4310 Spondylolisthesis, site unspecified: Secondary | ICD-10-CM | POA: Diagnosis not present
